# Patient Record
Sex: Male | Born: 1985 | Hispanic: No | Marital: Married | Smoking: Never smoker
Health system: Southern US, Community
[De-identification: ages and names within clinical notes are randomized; demographics above are authoritative.]

---

## 2015-11-21 ENCOUNTER — Ambulatory Visit (INDEPENDENT_AMBULATORY_CARE_PROVIDER_SITE_OTHER): Payer: Commercial Managed Care - HMO | Admitting: Family Medicine

## 2015-11-21 VITALS — BP 120/80 | HR 82 | Temp 97.9°F | Resp 20 | Ht 69.0 in | Wt 176.6 lb

## 2015-11-21 DIAGNOSIS — Z23 Encounter for immunization: Secondary | ICD-10-CM

## 2015-11-21 DIAGNOSIS — Z7189 Other specified counseling: Secondary | ICD-10-CM

## 2015-11-21 DIAGNOSIS — Z7185 Encounter for immunization safety counseling: Secondary | ICD-10-CM

## 2015-11-21 MED ORDER — VARICELLA VIRUS VACCINE LIVE 1350 PFU/0.5ML IJ SUSR: 0.5000 mL | Freq: Once | INTRAMUSCULAR | Status: AC

## 2015-11-21 NOTE — Addendum Note (Signed)
Addended by: Thelma BargeICHARDSON, Loghan Subia D on: 11/21/2015 07:45 PM   Modules accepted: Orders

## 2015-11-21 NOTE — Addendum Note (Signed)
Addended by: Elvina SidleLAUENSTEIN, Alsie Younes on: 11/21/2015 06:53 PM   Modules accepted: Orders

## 2015-11-21 NOTE — Patient Instructions (Signed)
Chicken pox:  2 shots at least 4 weeks apart Hepatitis B:  Three shots.  One today, one at least 4 weeks later, and one at least 6 months later Hepatitis A:  Two shots:  One today, one at least 5 months later MMR: one dose today.  Check blood 4 weeks later.

## 2015-11-21 NOTE — Progress Notes (Signed)
@  [facility]FCLOGO@  By signing my name below, I, Raven Small, attest that this documentation has been prepared under the direction and in the presence of Robyn Haber, MD.  Electronically Signed: Thea Alken, ED Scribe. 11/21/2015. 5:43 PM.  Patient ID: Jose Ramsey MRN: [id number], DOB: 1986/10/24, 29 y.o. Date of Encounter: 11/21/2015, 5:43 PM  Primary Physician: No primary care provider on file.  Chief Complaint:  Chief Complaint  Patient presents with  . Immunizations    HPI: 29 y.o. year old male with history below presents for immunization. Pt is requesting  MMR and Hep A and B vaccines. He is from [place] and does not have his medical records. Pt works as a Printmaker at [employer]. He is also needs a prescription for chicken pox vaccine to take to [facility].    History reviewed. No pertinent past medical history.   Home Meds: Prior to Admission medications   Not on File    Allergies: No Known Allergies  Social History   Social History  . Marital Status: Married    Spouse Name: N/A  . Number of Children: N/A  . Years of Education: N/A   Occupational History  . Not on file.   Social History Main Topics  . Smoking status: Never Smoker   . Smokeless tobacco: Not on file  . Alcohol Use: No  . Drug Use: No  . Sexual Activity: Not on file   Other Topics Concern  . Not on file   Social History Narrative  . No narrative on file     Review of Systems: Constitutional: negative for chills, fever, night sweats, weight changes, or fatigue  HEENT: negative for vision changes, hearing loss, congestion, rhinorrhea, ST, epistaxis, or sinus pressure Cardiovascular: negative for chest pain or palpitations Respiratory: negative for hemoptysis, wheezing, shortness of breath, or cough Abdominal: negative for abdominal pain, nausea, vomiting, diarrhea, or constipation Dermatological: negative for rash Neurologic: negative for headache, dizziness, or syncope All other  systems reviewed and are otherwise negative with the exception to those above and in the HPI.   Physical Exam: Blood pressure 120/80, pulse 82, temperature 97.9 F (36.6 C), temperature source Oral, resp. rate 20, height '5\' 9"'$  (1.753 m), weight 176 lb 9.6 oz (80.105 kg), SpO2 98 %., Body mass index is 26.07 kg/(m^2). General: Well developed, well nourished, in no acute distress. Head: Normocephalic, atraumatic, eyes without discharge, sclera non-icteric, nares are without discharge. Bilateral auditory canals clear, TM's are without perforation, pearly grey and translucent with reflective cone of light bilaterally. Oral cavity moist, posterior pharynx without exudate, erythema, peritonsillar abscess, or post nasal drip.  Msk:  Strength and tone normal for age. Extremities/Skin: Warm and dry. No clubbing or cyanosis. No edema. No rashes or suspicious lesions. Neuro: Alert and oriented X 3. Moves all extremities spontaneously. Gait is normal. CNII-XII grossly in tact. Psych:  Responds to questions appropriately with a normal affect.    ASSESSMENT AND PLAN:  29 y.o. year old male with  This chart was scribed in my presence and reviewed by me personally.    ICD-9-CM ICD-10-CM   1. Immunization counseling V65.49 Z71.89 Hepatitis A vaccine adult IM     Hepatitis B vaccine adult IM     MMR vaccine subcutaneous     CANCELED: Varicella vaccine subcutaneous      Signed, Robyn Haber, MD 11/21/2015 5:43 PM

## 2015-11-22 ENCOUNTER — Ambulatory Visit (INDEPENDENT_AMBULATORY_CARE_PROVIDER_SITE_OTHER): Payer: Commercial Managed Care - HMO

## 2015-11-22 ENCOUNTER — Ambulatory Visit (INDEPENDENT_AMBULATORY_CARE_PROVIDER_SITE_OTHER): Payer: Commercial Managed Care - HMO | Admitting: Family Medicine

## 2015-11-22 VITALS — BP 140/86 | HR 102 | Temp 98.0°F | Resp 20 | Wt 174.2 lb

## 2015-11-22 DIAGNOSIS — Z111 Encounter for screening for respiratory tuberculosis: Secondary | ICD-10-CM

## 2015-11-22 NOTE — Progress Notes (Signed)
Subjective:    Patient ID: Jose Ramsey, male    DOB: 06/27/1986, 29 y.o.   MRN: [id number]  11/22/2015  Other   HPI This 29 y.o. 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  Placed [facility] by [facility] by Dr. Silvano Bilisuan Huynh, MD.  Had a Tb skin test last year in 03/2014 that was negative.   Performed CXR last year; did not receive Tb skin test last year.  Immigration.  Born in [place].  [place] 11 years; Air cabin crewbusiness analyst. Not sure if received BCG in [place].      Fax [phone]   Phone:  [phone] [address] [place], [place]  [id number]   Tuberculosis Risk Questionnaire  1. Yes [place] Were you born outside the [place] in one of the following parts of the world: [place], [place], [place], [place] or [place]?    2. Yes Three weeks traveled to [place]. Have you traveled outside the [place] and lived for more than one month in one of the following parts of the world: [place], [place], [place], [place] or [place]?  None of these countries.    3. No Do you have a compromised immune system such as from any of the following conditions:HIV/AIDS, organ or bone marrow transplantation, diabetes, immunosuppressive medicines (e.g. Prednisone, Remicaide), leukemia, lymphoma, cancer of the head or neck, gastrectomy or jejunal bypass, end-stage renal disease (on dialysis), or silicosis?     4. No Have you ever or do you plan on working in: a residential care center, a health care facility, a jail or prison or homeless shelter?    5. No Have you ever: injected illegal drugs, used crack cocaine, lived in a homeless shelter  or been in jail or prison?     6. No Have you ever been exposed to anyone with infectious tuberculosis?    Tuberculosis Symptom Questionnaire  Do you currently have any of the following symptoms?  1. No Unexplained cough lasting more than 3 weeks?   2. No Unexplained fever lasting more than 3 weeks.   3. No Night  Sweats (sweating that leaves the bedclothes and sheets wet)     4. No Shortness of Breath   5. No Chest Pain   6. No Unintentional weight loss    7. No Unexplained fatigue (very tired for no reason)     Review of Systems  Constitutional: Negative for fever, chills, diaphoresis, activity change, appetite change, fatigue and unexpected weight change.  Respiratory: Negative for cough and shortness of breath.   Cardiovascular: Negative for chest pain, palpitations and leg swelling.  Gastrointestinal: Negative for nausea, vomiting, abdominal pain and diarrhea.  Endocrine: Negative for cold intolerance, heat intolerance, polydipsia, polyphagia and polyuria.  Skin: Negative for color change, rash and wound.  Neurological: Negative for dizziness, tremors, seizures, syncope, facial asymmetry, speech difficulty, weakness, light-headedness, numbness and headaches.  Psychiatric/Behavioral: Negative for sleep disturbance and dysphoric mood. The patient is not nervous/anxious.     History reviewed. No pertinent past medical history. History reviewed. No pertinent past surgical history. No Known Allergies Current Outpatient Prescriptions  Medication Sig Dispense Refill  . varicella virus vaccine live (VARIVAX) 1350 PFU/0.5ML INJ injection Inject 0.5 mLs into the skin once. 1 each 0   No current facility-administered medications for this visit.   Social History   Social History  . Marital Status: Married    Spouse Name: N/A  . Number of Children: N/A  .  Years of Education: N/A   Occupational History  . Not on file.   Social History Main Topics  . Smoking status: Never Smoker   . Smokeless tobacco: Not on file  . Alcohol Use: No  . Drug Use: No  . Sexual Activity: Not on file   Other Topics Concern  . Not on file   Social History Narrative   Family History  Problem Relation Age of Onset  . Diabetes Father        Objective:    BP 140/86 mmHg  Pulse 102  Temp(Src) 98 F  (36.7 C) (Oral)  Resp 20  Wt 174 lb 3.2 oz (79.017 kg)  SpO2 98% Physical Exam  Constitutional: He is oriented to person, place, and time. He appears well-developed and well-nourished. No distress.  HENT:  Head: Normocephalic and atraumatic.  Eyes: Conjunctivae and EOM are normal. Pupils are equal, round, and reactive to light.  Neck: Normal range of motion. Neck supple. Carotid bruit is not present. No thyromegaly present.  Cardiovascular: Normal rate, regular rhythm, normal heart sounds and intact distal pulses.  Exam reveals no gallop and no friction rub.   No murmur heard. Pulmonary/Chest: Effort normal and breath sounds normal. He has no wheezes. He has no rales.  Lymphadenopathy:    He has no cervical adenopathy.  Neurological: He is alert and oriented to person, place, and time. No cranial nerve deficit.  Skin: Skin is warm and dry. No rash noted. He is not diaphoretic.  Psychiatric: He has a normal mood and affect. His behavior is normal.  Nursing note and vitals reviewed.  No results found for this or any previous visit.    [facility] reading (PRIMARY) by  Dr. Katrinka Blazing. CXR: NAD.   Assessment & Plan:   1. Screening-pulmonary TB     Orders Placed This Encounter  Procedures  . DG Chest 1 View    Standing Status: Future     Number of Occurrences: 1     Standing Expiration Date: 11/21/2016    Order Specific Question:  Reason for Exam (SYMPTOM  OR DIAGNOSIS REQUIRED)    Answer:  screen for TB    Order Specific Question:  Preferred imaging location?    Answer:  External   No orders of the defined types were placed in this encounter.    No Follow-up on file.    Caleigha Zale Paulita Fujita, M.D. [facility] [address] [place], [place]  [address] [phone] phone [phone] fax

## 2015-11-23 ENCOUNTER — Telehealth: Payer: Self-pay | Admitting: Radiology

## 2015-11-23 NOTE — Telephone Encounter (Signed)
Copy of chest xray report mailed to pt as well as faxed to [phone].

## 2016-04-30 IMAGING — CR DG CHEST 1V
1 series · 1 of 1 positions shown · non-contrast
Comparison: None.

CLINICAL DATA: Positive TB skin test.

EXAM:
CHEST 1 VIEW

[PA]
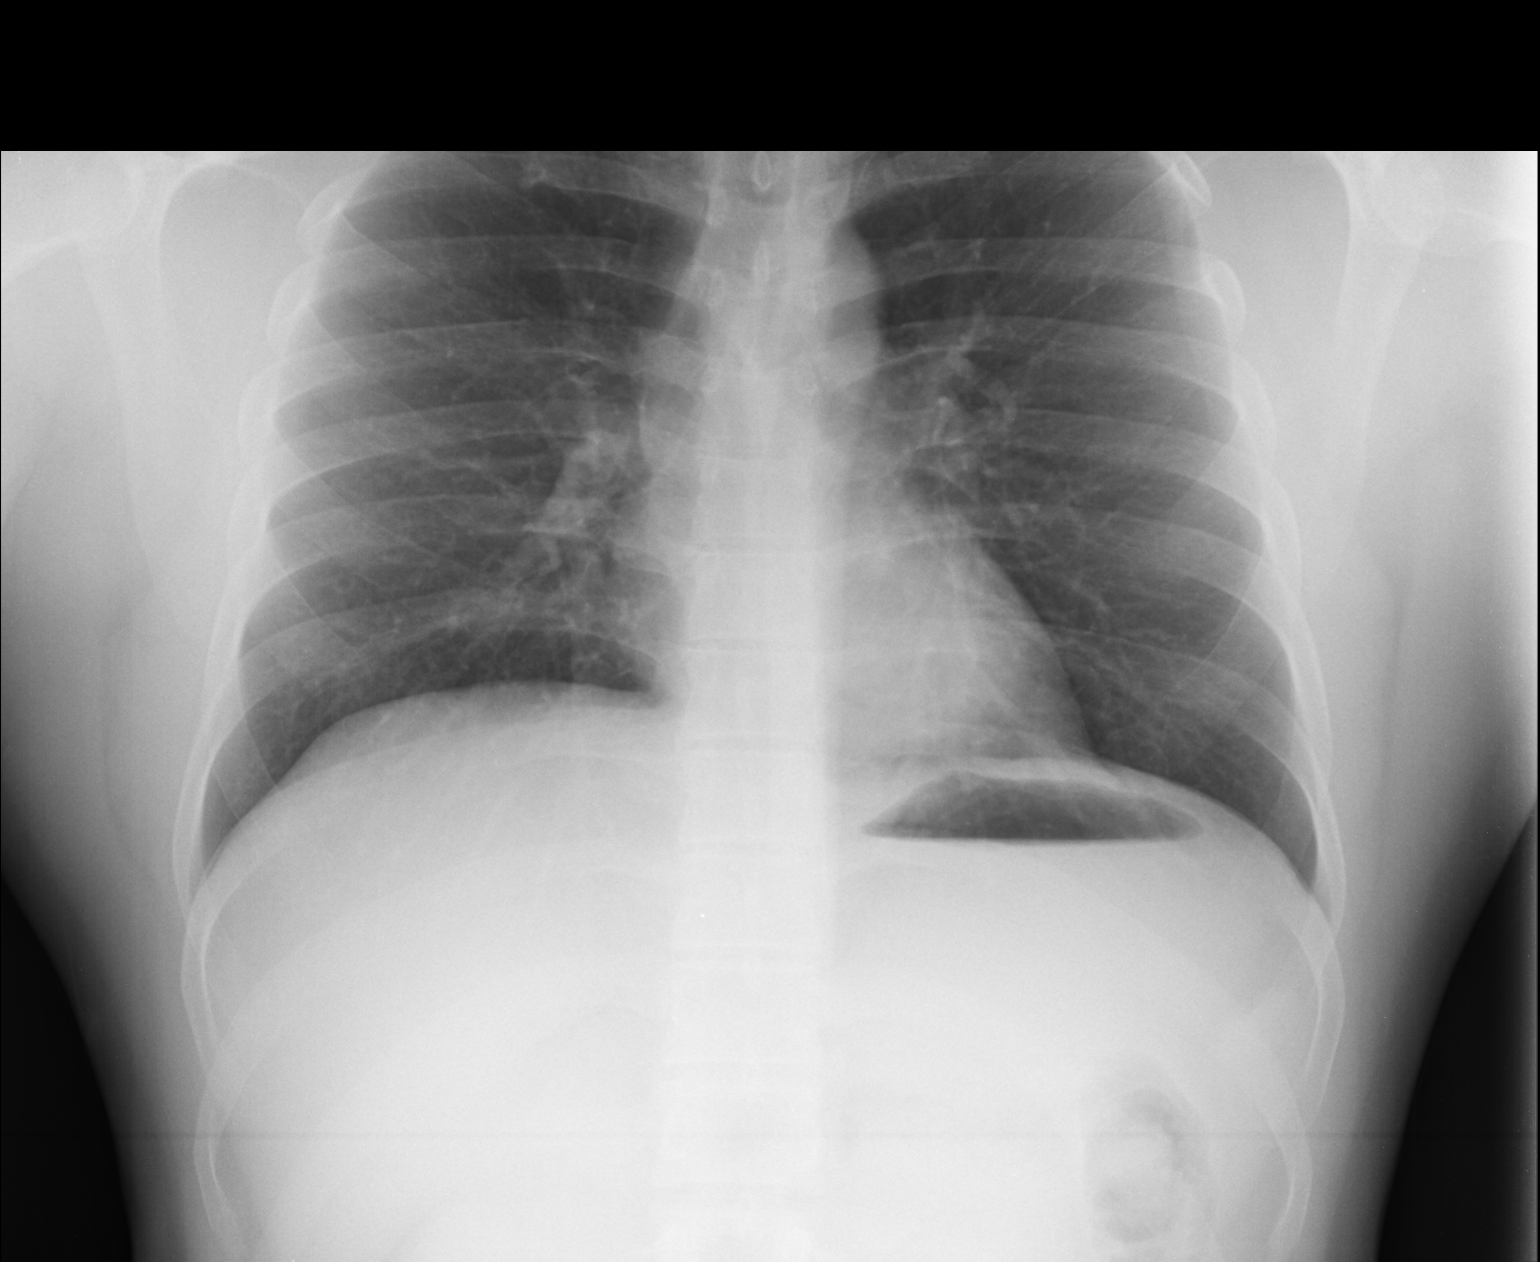

[1 of 1 positions shown; findings below may reference images not displayed]

FINDINGS: Cardiomediastinal silhouette is normal. Mediastinal contours appear
intact.

There is no evidence of focal airspace consolidation, pleural
effusion or pneumothorax.

Osseous structures are without acute abnormality. Soft tissues are
grossly normal.
IMPRESSION: No radiographic evidence of active tuberculosis.
